# Patient Record
Sex: Female | Born: 1995 | Race: White | Hispanic: No | Marital: Single | Smoking: Never smoker
Health system: Southern US, Community
[De-identification: ages and names within clinical notes are randomized; demographics above are authoritative.]

## PROBLEM LIST (undated history)

## (undated) DIAGNOSIS — L989 Disorder of the skin and subcutaneous tissue, unspecified: Secondary | ICD-10-CM

## (undated) DIAGNOSIS — B829 Intestinal parasitism, unspecified: Secondary | ICD-10-CM

## (undated) HISTORY — DX: Intestinal parasitism, unspecified: B82.9

## (undated) HISTORY — PX: OTHER SURGICAL HISTORY: SHX169

## (undated) HISTORY — DX: Disorder of the skin and subcutaneous tissue, unspecified: L98.9

---
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
## 2014-04-20 ENCOUNTER — Encounter (HOSPITAL_COMMUNITY): Payer: Self-pay | Admitting: Emergency Medicine

## 2014-04-20 ENCOUNTER — Emergency Department (INDEPENDENT_AMBULATORY_CARE_PROVIDER_SITE_OTHER): Payer: BC Managed Care – PPO

## 2014-04-20 ENCOUNTER — Emergency Department (INDEPENDENT_AMBULATORY_CARE_PROVIDER_SITE_OTHER)
Admission: EM | Admit: 2014-04-20 | Discharge: 2014-04-20 | Disposition: A | Discharge status: Home / Self Care | Payer: BC Managed Care – PPO | Source: Home / Self Care | Attending: Emergency Medicine | Admitting: Emergency Medicine

## 2014-04-20 DIAGNOSIS — S99922A Unspecified injury of left foot, initial encounter: Secondary | ICD-10-CM

## 2014-04-20 DIAGNOSIS — S99929A Unspecified injury of unspecified foot, initial encounter: Secondary | ICD-10-CM

## 2014-04-20 NOTE — ED Provider Notes (Signed)
CSN: [id number]     Arrival date & time 04/20/14  1759 History   First MD Initiated Contact with Patient 04/20/14 1808     Chief Complaint  Patient presents with  . Foot Injury   (Consider location/radiation/quality/duration/timing/severity/associated sxs/prior Treatment) HPI She is a 18 year old woman here with her mom for evaluation of left foot injury. She was moving her desk in her dorm room around noon today when the desk rolled over the lateral 3 toes of her left foot. She states she has some swelling in the lateral toes. She also thinks the middle toenail got partially pulled off. She is able to move her toes. She states that her foot feels slightly cold.  Past Medical History  Diagnosis Date  . Skin abnormalities   . Parasites in stool    Past Surgical History  Procedure Laterality Date  . Tubes in ears     Family History  Problem Relation Age of Onset  . Hypothyroidism Mother    History  Substance Use Topics  . Smoking status: Never Smoker   . Smokeless tobacco: Never Used  . Alcohol Use: No   OB History    No data available     Review of Systems As in history of present illness Allergies  Review of patient's allergies indicates no known allergies.  Home Medications   Prior to Admission medications   Medication Sig Start Date End Date Taking? Authorizing Provider  Cholecalciferol (VITAMIN D-3 PO) Take 10,000 Units by mouth. Take 3 caps 6 days a week.    Historical Provider, MD  Cyanocobalamin (B-12 SL) Place 5,000 mcg under the tongue daily. Take 1 tab on the tongue each morning.    Historical Provider, MD  EVENING PRIMROSE OIL PO Take 1,300 mg by mouth daily. Take 2 daily    Historical Provider, MD  fluconazole (DIFLUCAN) 100 MG tablet Take one po daily x 7 days 08/08/13   Amy S Esterwood, PA-C  MAGNESIUM GLYCINATE PLUS PO Take 4 tablets by mouth at bedtime.    Historical Provider, MD  Manganese 10 MG TABS Take by mouth.    Historical Provider, MD  METFORMIN  HCL PO Take 500 mg by mouth daily. Take 1 in the Am and 1 at bedtime.    Historical Provider, MD  NON FORMULARY Take 950 mg by mouth. Take Nutrient 950 , capsule , 1 daily.    Historical Provider, MD  OVER THE COUNTER MEDICATION Take 1 tablet by mouth daily. Lypozyme Capsule    Historical Provider, MD  Progesterone 200 MG CAPS Take 1 capsule by mouth daily.    Historical Provider, MD  vitamin A [id number] UNIT capsule Take 25,000 Units by mouth daily. Take 4 daily with food.    Historical Provider, MD   BP 118/78 mmHg  Pulse 74  Temp(Src) 98.3 F (36.8 C) (Oral)  Resp 18  SpO2 97%  LMP 03/26/2014 Physical Exam  Constitutional: She is oriented to person, place, and time. She appears well-developed and well-nourished. No distress.  Cardiovascular: Normal rate.   Pulmonary/Chest: Effort normal.  Musculoskeletal:  Left foot: mild swelling over lateral toes, no bruising.  Mild tenderness of middle toe.  Brisk cap refill in all toes.  No tenderness over metatarsals.  Neurological: She is alert and oriented to person, place, and time.    ED Course  Procedures (including critical care time) Labs Review Labs Reviewed - No data to display  Imaging Review Dg Foot Complete Left  04/20/2014   CLINICAL  DATA:  Left foot injury today. Toe pain and bruising. Initial encounter.  EXAM: LEFT FOOT - COMPLETE 3+ VIEW  COMPARISON:  None.  FINDINGS: The mineralization and alignment are normal. There is no evidence of acute fracture or dislocation. The joint spaces are maintained. No focal soft tissue swelling or foreign bodies identified.  IMPRESSION: No acute osseous findings.   Electronically Signed   By: Roxy HorsemanBill  Veazey M.D.   On: 04/20/2014 18:48     MDM   1. Toe injury   2. Toe injury    X-ray negative. Conservative management with ice, buddy taping, Tylenol or Motrin as needed for pain. Follow-up as needed.    Charm RingsErin J Honig, MD 04/20/14 [phone]

## 2014-04-20 NOTE — Discharge Instructions (Signed)
There are no broken bones in your foot. You can buddy tape the toes for comfort. Apply ice at least 3 times a day. Take ibuprofen or tylenol as needed for pain. Gradually return to weight bearing.  Follow up with PCP as needed.

## 2014-04-20 NOTE — ED Notes (Signed)
Patient c/o left foot injury onset today. Patient reports she rolled a desk over her foot. Has pain in last three toes and third digit has bruising and dried blood. Patient went to school nurse and the ace wrapped her foot and gave her crutches to walk on. Patient is in NAD.

## 2014-05-10 ENCOUNTER — Ambulatory Visit (INDEPENDENT_AMBULATORY_CARE_PROVIDER_SITE_OTHER): Payer: BLUE CROSS/BLUE SHIELD | Admitting: Podiatry

## 2014-05-10 ENCOUNTER — Encounter: Payer: Self-pay | Admitting: Podiatry

## 2014-05-10 ENCOUNTER — Ambulatory Visit: Payer: Self-pay | Admitting: Podiatry

## 2014-05-10 VITALS — BP 100/57 | HR 75 | Resp 18

## 2014-05-10 DIAGNOSIS — M79676 Pain in unspecified toe(s): Secondary | ICD-10-CM

## 2014-05-10 DIAGNOSIS — L600 Ingrowing nail: Secondary | ICD-10-CM

## 2014-05-10 NOTE — Progress Notes (Signed)
   Subjective:    Patient ID: Wanda Shelton, female    DOB: 06-02-1995, 18 y.o.   MRN: [id number]  HPI  18 year old the female presents the office today with her mother for complaints of painful ingrown toenails to both big toes. She states that they have been ongoing for several years and become intermittently painful. She states that over the last couple weeks the nail borders have become increasingly painful. She also states that she recently had a desk role over her left third toe and she was seen at urgent care on 04/20/2014 for x-rays were performed and she was told she had no fracture. Currently, she denies any pain to the toe. No other complaints at this time.    Review of Systems  All other systems reviewed and are negative.      Objective:   Physical Exam AAO x3, NAD DP/PT pulses palpable bilaterally, CRT less than 3 seconds Protective sensation intact with Simms Weinstein monofilament, vibratory sensation intact, Achilles tendon reflex intact Bilateral hallux with tenderness palpation over the medial and lateral nail borders with incurvation of the nails. There is slight localized erythema directly over the nail border site likelihood inflammation. There is no increase in warmth, edema, ascending cellulitis. There is no drainage or purulence expressed. Remaining nails without pathology. No tenderness to the third digit on the left foot at this time. There is no overlying edema, erythema, increase in warmth. No areas of pinpoint bony tenderness or pain with vibratory sensation. MMT 5/5, ROM WNL No pain with calf compression, swelling, warmth, erythema No open lesions or pre-ulcerative lesions.     Assessment & Plan:  18 year old female bilateral hallux medial/lateral symptomatically ingrown toenail -Treatment options were discussed with the patient/mother including alternatives, risks, complications. -At this time recommended medial/lateral partial nail avulsions to bilateral  hallux with chemical matricectomy due to chronic reoccurring painful ingrown toenail. Discussed with the patient the procedure and the postoperative course. The patient has obligations this weekend and would like to have the procedure performed on Monday. -Follow-up on Monday with Dr. Charlsie Merlesegal for likely bilateral hallux medial and lateral partial nail avulsions with chemical matrixectomy. - In the meantime, call the office with any questions, concerns, change in symptoms.

## 2014-05-14 ENCOUNTER — Ambulatory Visit (INDEPENDENT_AMBULATORY_CARE_PROVIDER_SITE_OTHER): Payer: BLUE CROSS/BLUE SHIELD | Admitting: Podiatry

## 2014-05-14 ENCOUNTER — Encounter: Payer: Self-pay | Admitting: Podiatry

## 2014-05-14 VITALS — BP 103/57 | HR 73 | Resp 16

## 2014-05-14 DIAGNOSIS — B351 Tinea unguium: Secondary | ICD-10-CM

## 2014-05-14 DIAGNOSIS — L600 Ingrowing nail: Secondary | ICD-10-CM

## 2014-05-14 NOTE — Patient Instructions (Signed)

## 2014-05-16 NOTE — Progress Notes (Signed)
Subjective:     Patient ID: Wanda Shelton, female   DOB: Apr 25, 1996, 19 y.o.   MRN: [id number]  HPI patient presents for permanent correction of ingrown toenails that of flexor both her big toes both medial lateral border with obvious incurvation of the nailbed noted by patient   Review of Systems     Objective:   Physical Exam Neurovascular status intact with incurvation of the hallux nails bilateral both medial lateral border with distal redness and irritation and pain when palpated    Assessment:     Ingrown toenail deformity hallux bilateral medial lateral borders    Plan:     H&P and condition discussed. I have recommended correction of the beds and explained the surgery and risk. Patient wants surgery and at this time I went ahead and I infiltrated each hallux 60 mg Xylocaine Marcaine mixture removed the medial and lateral borders exposed matrix and applied phenol 3 applications 30 seconds followed by alcohol lavaged and sterile dressing. Gave instructions on soaks and reappoint

## 2014-05-18 ENCOUNTER — Telehealth: Payer: Self-pay | Admitting: *Deleted

## 2014-05-18 NOTE — Telephone Encounter (Signed)
Pt's mtr, Cala BradfordKimberly states pt is having severe left flank and thigh pain, and had a toenail procedure on each foot 05/14/2014.  Cala BradfordKimberly asked if the chemical applied to her toes would cause pain in her thigh and hip area.  I asked the pt's mtr, if her toes were hurting up her leg, she stated no.  I spoke to Dr. Ardelle AntonWagoner and he stated the pain would not be in the thigh and hip area, but localized in the toe area, that she should go to the ER or urgent care center.  Dr. Gabriel RungWagoner's recommendation was given to the mtr and she agreed.

## 2014-05-23 ENCOUNTER — Other Ambulatory Visit: Payer: Self-pay | Admitting: Podiatry

## 2014-05-23 ENCOUNTER — Telehealth: Payer: Self-pay | Admitting: *Deleted

## 2014-05-23 MED ORDER — CEPHALEXIN 500 MG PO CAPS: 500.0000 mg | ORAL_CAPSULE | Freq: Three times a day (TID) | ORAL | Status: AC

## 2014-05-23 NOTE — Telephone Encounter (Signed)
Patient sent an email stating,                                                                                                                                                                                                                                                                                                                                                              Deludes and Shanda BumpsJessica,  Our daughter is a patient at [facility] in [place]. Her name is Barb MerinoLilianna Sass and birthdate is Jun 04, 1995. In Monday January 4th she had ingrown toenails removed from both big toes on both sides of the toes. On Sunday we took her back to college and a friend accidentally stepped on her left toe. Also, last night she slept on her stomach with her toe straight down on the mattress. Below is a picture of what it looks like now:  We are not sure if this is normal. She says her right toe is not red like this one. She is going to soak it in Epsom salt this afternoon. Should she see a doctor or do you think it is recovering from being stepped on? Please feel free to call or respond to this email. I would appreciate a simple email letting me know that you received this email and working on it.  My number is: [phone]  Thank you for your time.   Blessings, Bridget HartshornKimberly Matsuoka  I emailed her back and advised.  Have her to continue doing the soaks and apply triple antibiotic ointment and a band-aid. If not doing better in next few days, please have her to see a doctor. It is normal to still have drainage at this time. As long as there is drainage, she needs to continue doing soaks and dressings. If the drainage has a foul odor  or toe becomes increasingly red please see a doctor immediately may need an antibiotic. Please let me know if you have any further questions or concerns. Thanks, ZOXWRUE

## 2014-06-11 ENCOUNTER — Encounter: Payer: Self-pay | Admitting: Podiatry

## 2014-06-26 ENCOUNTER — Telehealth: Payer: Self-pay | Admitting: *Deleted

## 2014-06-26 NOTE — Telephone Encounter (Addendum)
Dr. Charlsie Merlesegal states pt's fungal results.  I left message [phone] to call our office for results of testing.  Dr. 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  Pt's mtr called 1 week later for results and states would like to research the medication and organism.

## 2014-10-19 ENCOUNTER — Telehealth: Payer: Self-pay | Admitting: Family Medicine

## 2014-10-19 NOTE — Telephone Encounter (Signed)
Patients mother is unable to be with her on Tuesday. She requests that they are fit in Wednesday afternoon. Please advise if possible.

## 2014-10-22 NOTE — Telephone Encounter (Signed)
Spoke to pt's mother, rescheduled appt for Wednesday @ 12:45p.

## 2014-10-23 ENCOUNTER — Ambulatory Visit: Payer: Self-pay | Admitting: Family Medicine

## 2014-10-24 ENCOUNTER — Encounter: Payer: Self-pay | Admitting: Family Medicine

## 2014-10-24 ENCOUNTER — Ambulatory Visit (INDEPENDENT_AMBULATORY_CARE_PROVIDER_SITE_OTHER): Payer: BLUE CROSS/BLUE SHIELD | Admitting: Family Medicine

## 2014-10-24 ENCOUNTER — Other Ambulatory Visit (INDEPENDENT_AMBULATORY_CARE_PROVIDER_SITE_OTHER): Payer: BLUE CROSS/BLUE SHIELD

## 2014-10-24 VITALS — BP 108/76 | HR 70 | Ht 67.0 in | Wt 198.0 lb

## 2014-10-24 DIAGNOSIS — M25562 Pain in left knee: Secondary | ICD-10-CM | POA: Diagnosis not present

## 2014-10-24 DIAGNOSIS — M222X9 Patellofemoral disorders, unspecified knee: Secondary | ICD-10-CM | POA: Insufficient documentation

## 2014-10-24 NOTE — Progress Notes (Signed)
[facility] [address] [place], [place] [address] Phone: [phone] Subjective:    I'm seeing this patient by the request  of:  VAUGHAN,ELIZABETH, MD   CC: Bilateral knee pain   [id number] Wanda Shelton is a 19 y.o. female coming in with complaint of lateral knee pain. Patient has had this pain for multiple years. Patient does not rib or any true injury but states that she does her member falling on her knee once and having pain on the anterior aspect of her knee. Stops her from any activity but it is sore. Patient states that sometimes if she does a lot of activity in the day she has to stop prematurely. Patient denies any swelling but states sometimes it can click can sometimes feel like it's going to give out on her. Patient tries to remain active but cannot work on a regular basis she states because unfortunately sometimes it gives her pain. Patient has tried some over-the-counter medications with no significant improvement and patient is now seen and integrated physician and taking some natural vitamins but has not notice any significant improvement. Patient rates the severity of pain a 6 out of 10. Denies any radiation down the leg or any numbness or weakness. Patient is working as well as taking Estate agent.  Past Medical History  Diagnosis Date  . Skin abnormalities   . Parasites in stool    Past Surgical History  Procedure Laterality Date  . Tubes in ears     History  Substance Use Topics  . Smoking status: Never Smoker   . Smokeless tobacco: Never Used  . Alcohol Use: No   No Known Allergies Family History  Problem Relation Age of Onset  . Hypothyroidism Mother         Past medical history, social, surgical and family history all reviewed in electronic medical record.   Review of Systems: No headache, visual changes, nausea, vomiting, diarrhea, constipation, dizziness, abdominal pain, skin rash, fevers, chills, night sweats,  weight loss, swollen lymph nodes, body aches, joint swelling, muscle aches, chest pain, shortness of breath, mood changes.   Objective Blood pressure 108/76, pulse 70, height  (1.702 m), weight 198 lb (89.812 kg), SpO2 97 %.  General: No apparent distress alert and oriented x3 mood and affect normal, dressed appropriately.  HEENT: Pupils equal, extraocular movements intact  Respiratory: Patient's speak in full sentences and does not appear short of breath  Cardiovascular: No lower extremity edema, non tender, no erythema  Skin: Warm dry intact with no signs of infection or rash on extremities or on axial skeleton.  Abdomen: Soft nontender  Neuro: Cranial nerves II through XII are intact, neurovascularly intact in all extremities with 2+ DTRs and 2+ pulses.  Lymph: No lymphadenopathy of posterior or anterior cervical chain or axillae bilaterally.  Gait normal with good balance and coordination.  MSK:  Non tender with full range of motion and good stability and symmetric strength and tone of shoulders, elbows, wrist, hip, and ankles bilaterally.  Knee: Bilateral Normal to inspection with no erythema or effusion or obvious bony abnormalities. Minimal tenderness over the superior lateral aspect of the patella ROM full in flexion and extension and lower leg rotation. Ligaments with solid consistent endpoints including ACL, PCL, LCL, MCL. Negative Mcmurray's, Apley's, and Thessalonian tests. Positive painful patellar compression. Patellar glide with moderate crepitus. Patellar and quadriceps tendons unremarkable. Hamstring and quadriceps strength is normal.   MSK US performed of: Bilateral This study  was ordered, performed, and interpreted by Terrilee Files D.O.  Knee: All structures visualized. Anteromedial, anterolateral, posteromedial, and posterolateral menisci unremarkable without tearing, fraying, effusion, or displacement. Patient does have what appears to be some mild hypoechoic  changes of the superior lateral patellofemoral joint. No significant narrowing of the joint space Patellar Tendon unremarkable on long and transverse views without effusion. No abnormality of prepatellar bursa. LCL and MCL unremarkable on long and transverse views. No abnormality of origin of medial or lateral head of the gastrocnemius.  IMPRESSION:  Patellofemoral  Procedure note 97110; 15 minutes spent for Therapeutic exercises as stated in above notes.  This included exercises focusing on stretching, strengthening, with significant focus on eccentric aspects. She given exercises to work on the vastus medialis oblique as well as hip abductor strengthening and we discussed hip flexors stretching   Proper technique shown and discussed handout in great detail with ATC.  All questions were discussed and answered.     Impression and Recommendations:     This case required medical decision making of moderate complexity.

## 2014-10-24 NOTE — Patient Instructions (Signed)
Vitamin D 5000 IU daily Turmeric 500mg  twice daily Tart cherry extract Stop Vitamin A Exercises 3 times a week.  Ice 20 minutes 2 times daily. Usually after activity and before bed. pennsaid pinkie amount topically 2 times daily as needed.  See me again in 3-4  weeks if not perfect I got a lot of tricks.

## 2014-10-24 NOTE — Assessment & Plan Note (Signed)

## 2014-10-24 NOTE — Progress Notes (Signed)
Pre visit review using our clinic review tool, if applicable. No additional management support is needed unless otherwise documented below in the visit note. 

## 2014-11-14 ENCOUNTER — Ambulatory Visit: Payer: BLUE CROSS/BLUE SHIELD | Admitting: Family Medicine

## 2015-07-20 IMAGING — CR DG FOOT COMPLETE 3+V*L*
3 series · 3 of 3 positions shown · non-contrast
Comparison: None.

CLINICAL DATA: Left foot injury today. Toe pain and bruising.
Initial encounter.

EXAM:
LEFT FOOT - COMPLETE 3+ VIEW

[foot ap]
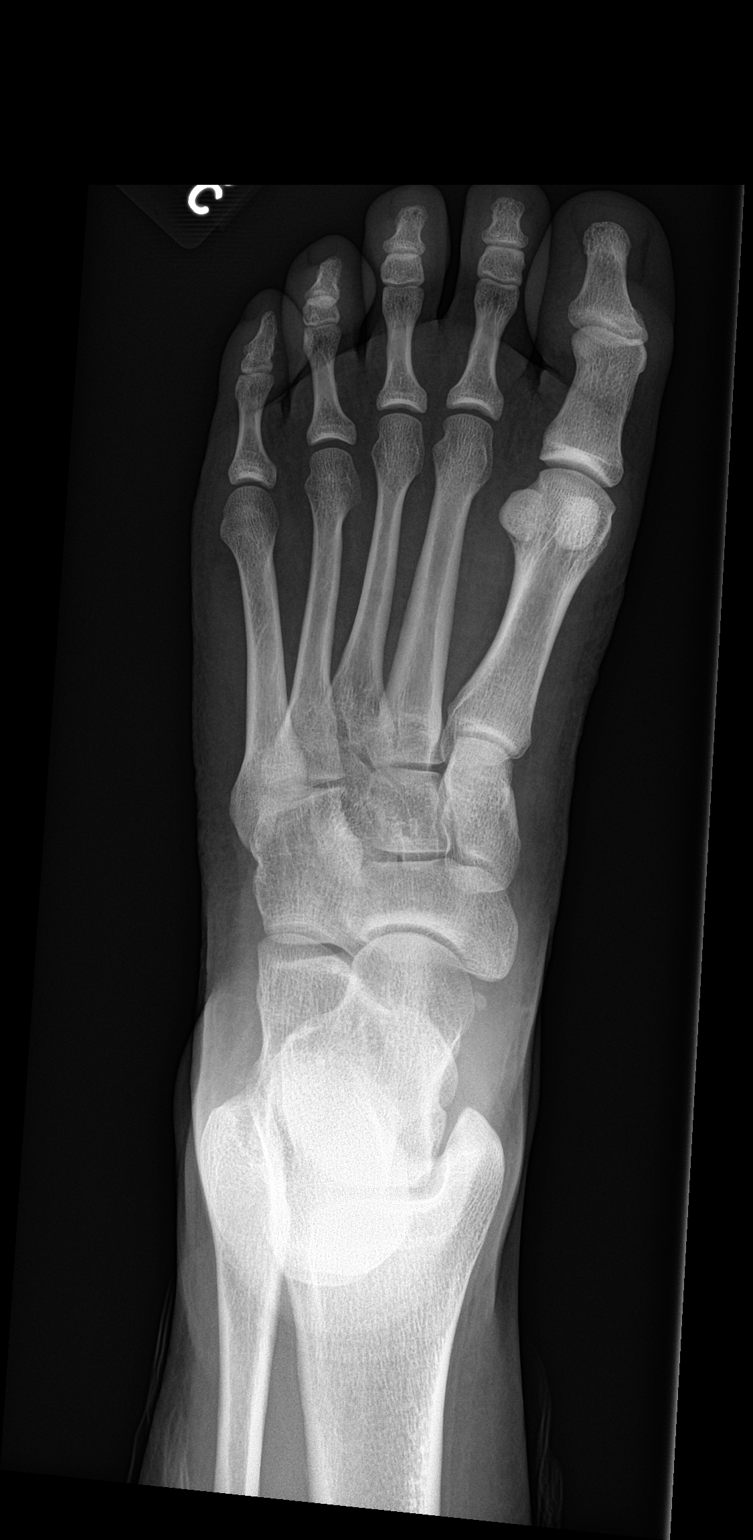

[foot obl]
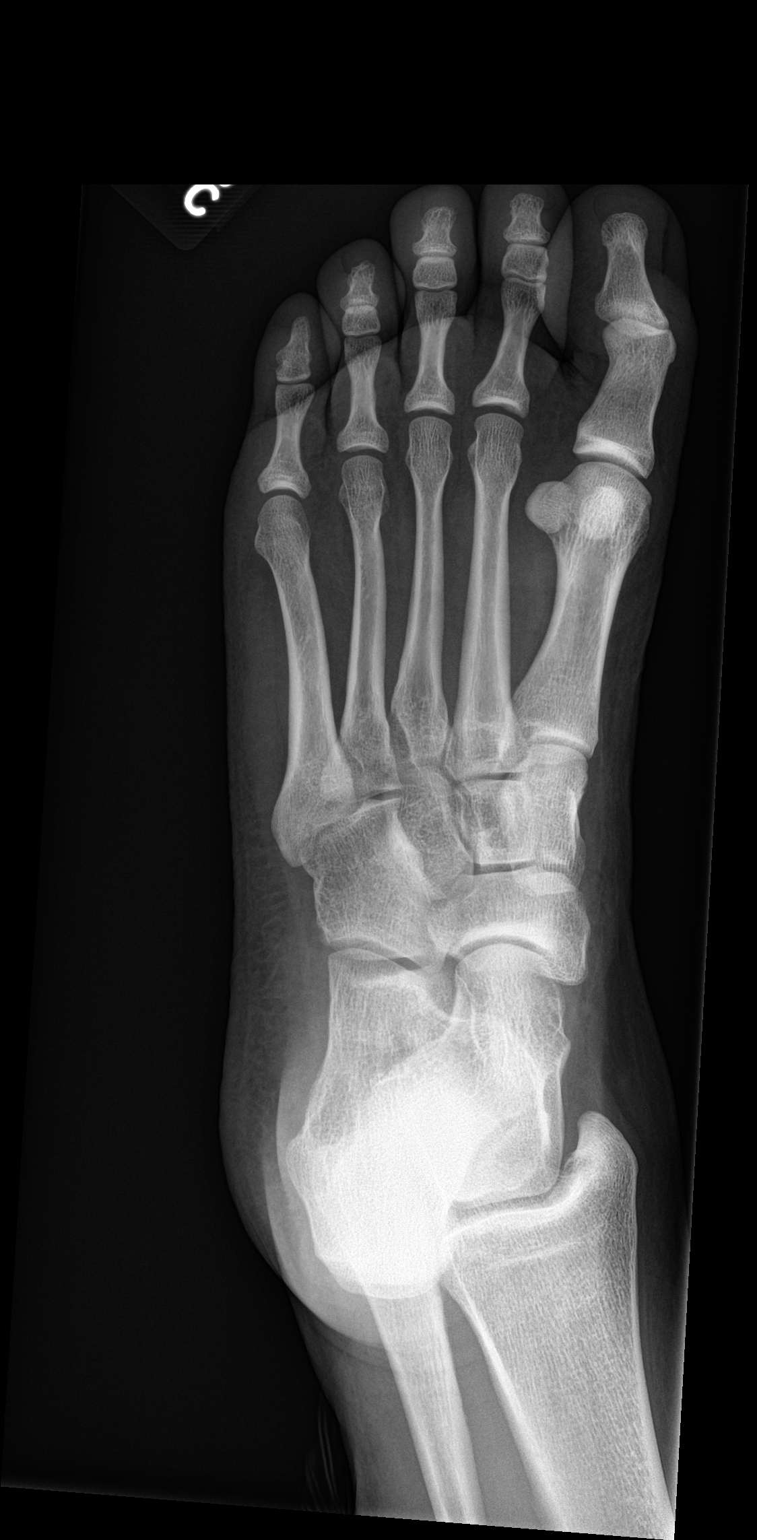

[foot lat]
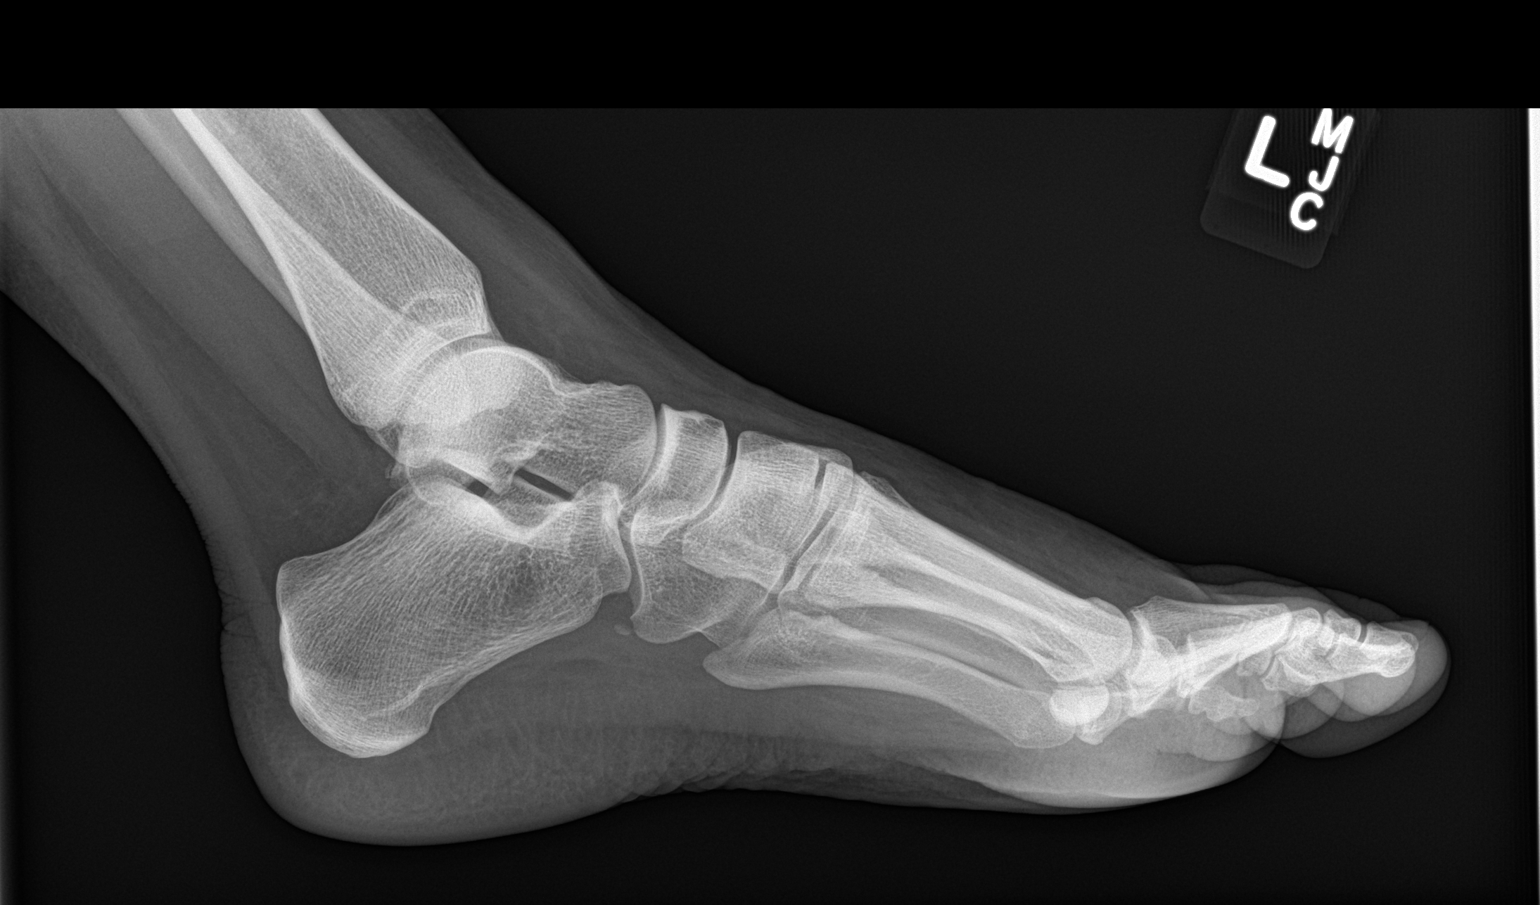

[3 of 3 positions shown; findings below may reference images not displayed]

FINDINGS: The mineralization and alignment are normal. There is no evidence of
acute fracture or dislocation. The joint spaces are maintained. No
focal soft tissue swelling or foreign bodies identified.
IMPRESSION: No acute osseous findings.

## 2016-11-21 NOTE — Progress Notes (Deleted)
[facility] [address] [place], [place] [address] Phone: [phone] Subjective:    I'm seeing this patient by the request  of:    CC: Ankle pain  [id number]  Wanda Shelton is a 21 y.o. female coming in with complaint of ankle pain.     Past Medical History:  Diagnosis Date  . Parasites in stool   . Skin abnormalities    Past Surgical History:  Procedure Laterality Date  . Tubes in ears     Social History   Social History  . Marital status: Single    Spouse name: N/A  . Number of children: N/A  . Years of education: N/A   Social History Main Topics  . Smoking status: Never Smoker  . Smokeless tobacco: Never Used  . Alcohol use No  . Drug use: No  . Sexual activity: Not on file   Other Topics Concern  . Not on file   Social History Narrative  . No narrative on file   No Known Allergies Family History  Problem Relation Age of Onset  . Hypothyroidism Mother     Past medical history, social, surgical and family history all reviewed in electronic medical record.  No pertanent information unless stated regarding to the chief complaint.   Review of Systems:Review of systems updated and as accurate as of 11/21/16  No headache, visual changes, nausea, vomiting, diarrhea, constipation, dizziness, abdominal pain, skin rash, fevers, chills, night sweats, weight loss, swollen lymph nodes, body aches, joint swelling, muscle aches, chest pain, shortness of breath, mood changes.   Objective  There were no vitals taken for this visit. Systems examined below as of 11/21/16   General: No apparent distress alert and oriented x3 mood and affect normal, dressed appropriately.  HEENT: Pupils equal, extraocular movements intact  Respiratory: Patient's speak in full sentences and does not appear short of breath  Cardiovascular: No lower extremity edema, non tender, no erythema  Skin: Warm dry intact with no signs of infection or rash on  extremities or on axial skeleton.  Abdomen: Soft nontender  Neuro: Cranial nerves II through XII are intact, neurovascularly intact in all extremities with 2+ DTRs and 2+ pulses.  Lymph: No lymphadenopathy of posterior or anterior cervical chain or axillae bilaterally.  Gait normal with good balance and coordination.  MSK:  Non tender with full range of motion and good stability and symmetric strength and tone of shoulders, elbows, wrist, hip, knee bilaterally.  Ankle: No visible erythema or swelling. Range of motion is full in all directions. Strength is 5/5 in all directions. Stable lateral and medial ligaments; squeeze test and kleiger test unremarkable; Talar dome nontender; No pain at base of 5th MT; No tenderness over cuboid; No tenderness over N spot or navicular prominence No tenderness on posterior aspects of lateral and medial malleolus No sign of peroneal tendon subluxations or tenderness to palpation Negative tarsal tunnel tinel's Able to walk 4 steps.  MSK US performed of: *** This study was ordered, performed, and interpreted by Terrilee Files D.O.  Foot/Ankle:   All structures visualized.   Talar dome unremarkable  Ankle mortise without effusion. Peroneus longus and brevis tendons unremarkable on long and transverse views without sheath effusions. Posterior tibialis, flexor hallucis longus, and flexor digitorum longus tendons unremarkable on long and transverse views without sheath effusions. Achilles tendon visualized along length of tendon and unremarkable on long and transverse views without sheath effusion. Anterior Talofibular Ligament and Calcaneofibular Ligaments unremarkable  and intact. Deltoid Ligament unremarkable and intact. Plantar fascia intact and without effusion, normal thickness. No increased doppler signal, cap sign, or thickening of tibial cortex. Power doppler signal normal.  IMPRESSION:  NORMAL ULTRASONOGRAPHIC EXAMINATION OF THE FOOT/ANKLE.       Impression and Recommendations:     This case required medical decision making of moderate complexity.      Note: This dictation was prepared with Dragon dictation along with smaller phrase technology. Any transcriptional errors that result from this process are unintentional.

## 2016-11-23 ENCOUNTER — Ambulatory Visit: Payer: BLUE CROSS/BLUE SHIELD | Admitting: Family Medicine

## 2016-12-07 ENCOUNTER — Telehealth: Payer: Self-pay | Admitting: Internal Medicine

## 2016-12-07 NOTE — Telephone Encounter (Signed)
Mom called said that she called and cancelled the 7/16 appt but she rec'd a letter and a charge .  She would like this corrected

## 2016-12-15 NOTE — Telephone Encounter (Signed)
Please inform patient [facility] are [facility]

## 2016-12-16 ENCOUNTER — Telehealth: Payer: Self-pay | Admitting: Internal Medicine

## 2016-12-16 NOTE — Telephone Encounter (Signed)
Call pt and left message that this has been resolved

## 2017-04-28 ENCOUNTER — Ambulatory Visit: Payer: BLUE CROSS/BLUE SHIELD | Admitting: Physician Assistant

## 2017-08-10 ENCOUNTER — Encounter: Payer: Self-pay | Admitting: Physician Assistant
